# Patient Record
Sex: Female | Born: 1935 | Race: Black or African American | Hispanic: No | Smoking: Never smoker
Health system: Southern US, Community
[De-identification: ages and names within clinical notes are randomized; demographics above are authoritative.]

---

## 2010-09-02 ENCOUNTER — Inpatient Hospital Stay (HOSPITAL_COMMUNITY): Payer: Medicare Other

## 2010-09-02 ENCOUNTER — Emergency Department (HOSPITAL_COMMUNITY): Payer: Medicare Other

## 2010-09-02 ENCOUNTER — Observation Stay (HOSPITAL_COMMUNITY)
Admission: EM | Admit: 2010-09-02 | Discharge: 2010-09-05 | DRG: 563 | Disposition: A | Discharge status: Home Health | Payer: Medicare Other | Attending: Specialist | Admitting: Specialist

## 2010-09-02 DIAGNOSIS — G309 Alzheimer's disease, unspecified: Secondary | ICD-10-CM | POA: Diagnosis present

## 2010-09-02 DIAGNOSIS — W19XXXA Unspecified fall, initial encounter: Secondary | ICD-10-CM | POA: Diagnosis present

## 2010-09-02 DIAGNOSIS — Z9889 Other specified postprocedural states: Secondary | ICD-10-CM

## 2010-09-02 DIAGNOSIS — I10 Essential (primary) hypertension: Secondary | ICD-10-CM | POA: Diagnosis present

## 2010-09-02 DIAGNOSIS — S82109A Unspecified fracture of upper end of unspecified tibia, initial encounter for closed fracture: Principal | ICD-10-CM | POA: Diagnosis present

## 2010-09-02 DIAGNOSIS — F0280 Dementia in other diseases classified elsewhere without behavioral disturbance: Secondary | ICD-10-CM | POA: Diagnosis present

## 2010-09-02 DIAGNOSIS — G40802 Other epilepsy, not intractable, without status epilepticus: Secondary | ICD-10-CM | POA: Diagnosis present

## 2010-09-02 LAB — CBC
HCT: 32.3 % — ABNORMAL LOW (ref 36.0–46.0)
Hemoglobin: 10.4 g/dL — ABNORMAL LOW (ref 12.0–15.0)
RBC: 3.94 MIL/uL (ref 3.87–5.11)
WBC: 7.4 10*3/uL (ref 4.0–10.5)

## 2010-09-02 LAB — URINALYSIS, ROUTINE W REFLEX MICROSCOPIC
Glucose, UA: NEGATIVE mg/dL
Hgb urine dipstick: NEGATIVE
Ketones, ur: NEGATIVE mg/dL
Nitrite: NEGATIVE
Protein, ur: NEGATIVE mg/dL
pH: 7 (ref 5.0–8.0)

## 2010-09-02 LAB — DIFFERENTIAL
Basophils Absolute: 0 10*3/uL (ref 0.0–0.1)
Lymphocytes Relative: 34 % (ref 12–46)
Monocytes Absolute: 0.8 10*3/uL (ref 0.1–1.0)
Neutro Abs: 3.8 10*3/uL (ref 1.7–7.7)
Neutrophils Relative %: 52 % (ref 43–77)

## 2010-09-02 LAB — PROTIME-INR: INR: 1.01 (ref 0.00–1.49)

## 2010-09-02 LAB — APTT: aPTT: 27 seconds (ref 24–37)

## 2010-09-02 LAB — COMPREHENSIVE METABOLIC PANEL
ALT: 9 U/L (ref 0–35)
Albumin: 3.1 g/dL — ABNORMAL LOW (ref 3.5–5.2)
Alkaline Phosphatase: 63 U/L (ref 39–117)
Calcium: 10.2 mg/dL (ref 8.4–10.5)
GFR calc Af Amer: >60 mL/min (ref >60)
Potassium: 4.1 mEq/L (ref 3.5–5.1)
Sodium: 138 mEq/L (ref 135–145)
Total Protein: 6.7 g/dL (ref 6.0–8.3)

## 2010-09-03 ENCOUNTER — Inpatient Hospital Stay (HOSPITAL_COMMUNITY): Payer: Medicare Other

## 2010-09-14 NOTE — H&P (Signed)
  Catherine Delgado, Catherine Delgado              ACCOUNT NO.:  [id number]  MEDICAL RECORD NO.:  [id number]           PATIENT TYPE:  I  LOCATION:  5002                         FACILITY:  [facility]  PHYSICIAN:  Jene Every, M.D.    DATE OF BIRTH:  Sep 09, 1930  DATE OF ADMISSION:  09/02/2010 DATE OF DISCHARGE:                             HISTORY & PHYSICAL   CHIEF COMPLAINT:  Left leg pain.  HISTORY:  This is a 77 year old female who fell yesterday, history of Alzheimer's presents here with her daughter who is a travelling physician with a closed left proximal tibia fracture, who was seen in the Urgent Care last night and referred here for evaluation.  She has had no fevers or change in bowel or bladder function,  pain wakes him at night, unexplained recent weight loss.  PAST MEDICAL HISTORY:  Significant for hypertension, seizure disorder, and Alzheimer's.  ALLERGIES:  None.  SOCIAL HISTORY:  She lives with her daughter.  Tobacco, negative.  Alcohol beverages, negative.  MEDICATIONS:  Clonidine patch 0.2 mg weekly on Sunday.  PAST SURGICAL HISTORY:  ORIF of the hip and the distal femurs which resulted in a short left lower extremity with a fixed declines contracture of her foot.  PHYSICAL EXAMINATION:  HEENT: Within normal limits.  She is alert and oriented. PULMONARY:  Clear to auscultation. ABDOMEN:  Soft, nontender. COR:  Regular rate and rhythm. ABDOMEN:  Soft, nontender. PELVIS:  Stable left lower extremity, moderate soft tissue swelling of the knee.  Compartments are soft.  She has a plantar flexion of the foot.  There is 1+ dorsalis pedis posterior tibial pulses, which were equivalent bilaterally.  Upper extremity exam is unremarkable.  Radiographs demonstrate minimal evidence of proximal tibia fracture with osteopenia.  The hardware from the distal femoral fracture is noted.  LABORATORY DATA:  White count 7.4, hemoglobin 10.4, platelets 270, INR 1.01.  BMET is  pending.  IMPRESSION: 1. Closed left proximal tibia fracture, minimal displacement to     history of an ORIF of the ipsilateral hip and distal femur with     resultant short lower extremity external rotation, equinus     contracture. 2. Alzheimer's. 3. The patient's family reports history of multiple falls.  PLAN: 1. We discussed options in detail.  ORIF was a conservative treatment,     mutually agreed attempt conservative treatment at first, placement     of Jones dressing and a knee immobilizer followed by a     postreduction radiographs.  We performed a general reduction. 2. Admit for elevation, ice, neurovascular checks and disposition. 3. Deep venous thrombosis prophylaxis.  He has had aspirin for DVT     prophylaxis with multiple falls.  Head injury is possible and     therefore want to reduce the risk of intracranial bleed.  Discussed     this with the family in detail.     Jene Every, M.D.     Catherine Delgado  D:  09/02/2010  T:  09/03/2010  Job:  [id number]  Electronically Signed by Jene Every M.D. on 09/14/2010 01:29:59 PM

## 2010-09-14 NOTE — Discharge Summary (Signed)
  NAMEAMILIAH, Catherine Delgado              ACCOUNT NO.:  [id number]  MEDICAL RECORD NO.:  [id number]           PATIENT TYPE:  I  LOCATION:  5002                         FACILITY:  [facility]  PHYSICIAN:  Jene Every, M.D.    DATE OF BIRTH:  01-30-1931  DATE OF ADMISSION:  09/02/2010 DATE OF DISCHARGE:  09/05/2010                              DISCHARGE SUMMARY   ADMISSION DIAGNOSES: 1. Status post fall with left proximal tibia fracture. 2. Hypertension. 3. Seizure disorder. 4. Alzheimer's.  DISCHARGE DIAGNOSES: 1. Status post fall with left proximal tibia fracture. 2. Hypertension. 3. Seizure disorder. 4. Alzheimer's. 5. Status post closed reduction of left proximal tibia fracture.  HISTORY:  Catherine Delgado is a pleasant 77 year old female who unfortunately had a history of a fall.  She was traveling with her daughter who was a physician.  She was seen at Urgent Care and noted to have a close proximal tibia fracture, then referred to the emergency room for admission and definitive treatment.  PROCEDURES:  Dr. Shelle Iron performed closed reduction, application of Jones dressing as well as knee immobilizer without any sedation.  The patient did well with this.  The patient was admitted for pain management and placement issues.  ADMISSION LABORATORY DATA:  White cell count 7.4, hemoglobin 10.4, and hematocrit 32.3.  Chemistry shows sodium 138, potassium 4.1 with normal BUN and creatinine.  Urinalysis was negative.  No additional laboratory studies were performed during the hospital course.  RADIOGRAPHS:  Initial radiographs of the tibia showed slight displacement of proximal tibia fracture.  Repeat films showed anatomic alignment.  Admission chest x-ray showed borderline enlargement of cardiac silhouette.  Hospital course was uneventful.  The patient was admitted for pain control.  Treatment options were discussed with the patient as well as her daughter.  At this point, due to her  medical status, we would recommend conservative treatment with immobilization for approximately 6- 8 weeks with nonweightbearing at this point.  Aspirin for DVT prophylaxis.  The patient was in the hospital approximately 3 days. Again, hospital course was uneventful.  On admission day #3, it was felt the patient was stable to be discharged to nursing facility of choice. She should follow up with Dr. Shelle Iron in approximately 10-14 days for x- rays.  WOUND CARE:  Currently, she is in a Jones dressing as well as application of knee immobilizer.  She should be in this at all times daily and neurovascular checks should be performed to the left lower extremity.  She is to be nonweightbearing, ice and elevation as needed.  DISCHARGE MEDICATIONS:  As per med rec sheet.  DIET:  As tolerated.  CONDITION ON DISCHARGE:  Stable.  FINAL DIAGNOSIS:  Status post proximal left tibia fracture which will undergo conservative treatment.     Roma Schanz, P.A.   ______________________________ Jene Every, M.D.    CS/MEDQ  D:  09/04/2010  T:  09/05/2010  Job:  [id number]  Electronically Signed by Roma Schanz P.A. on 09/05/2010 09:26:18 AM Electronically Signed by Jene Every M.D. on 09/14/2010 01:29:57 PM

## 2011-10-26 DEATH — deceased

## 2012-10-16 IMAGING — CR DG TIBIA/FIBULA 2V*L*
2 series · 2 of 2 positions shown · non-contrast
Comparison: 09/02/2010

CLINICAL DATA: Trauma, fall, proximal tibia fracture

LEFT TIBIA AND FIBULA - 2 VIEW

[view not recorded (1 of 2)]
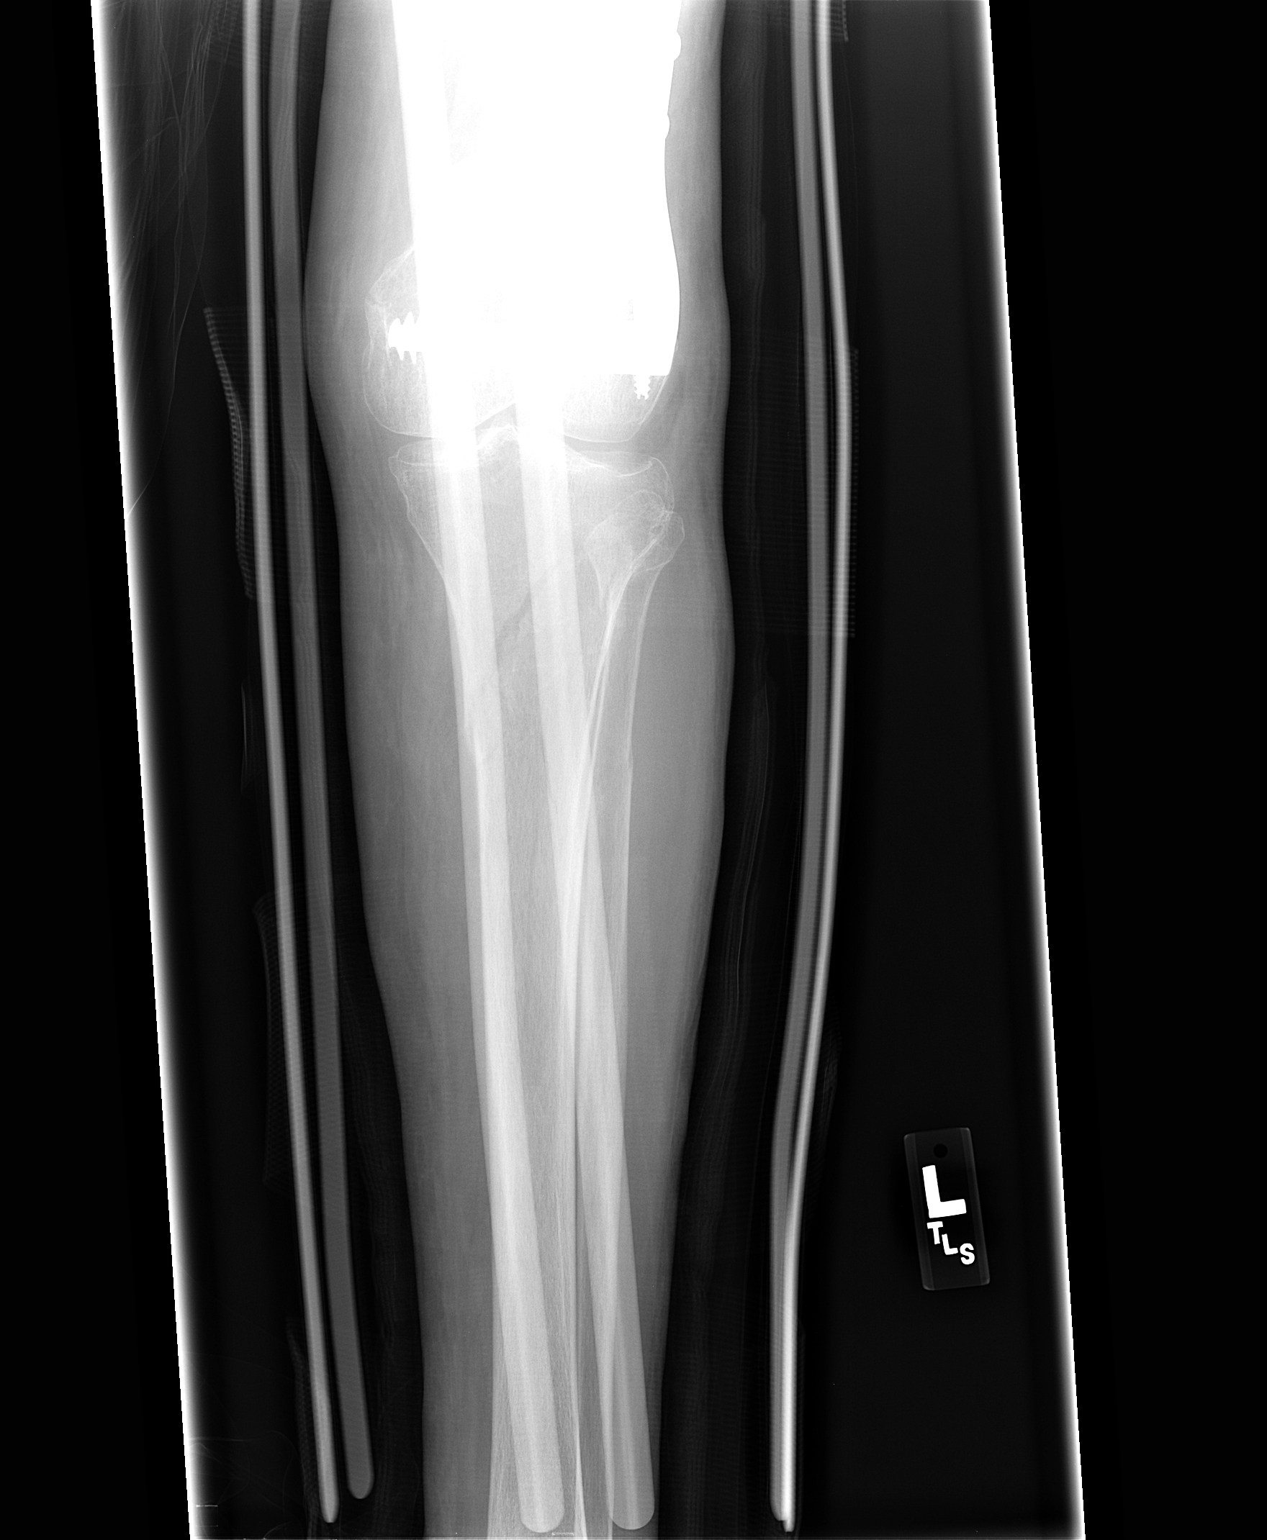

[view not recorded (2 of 2)]
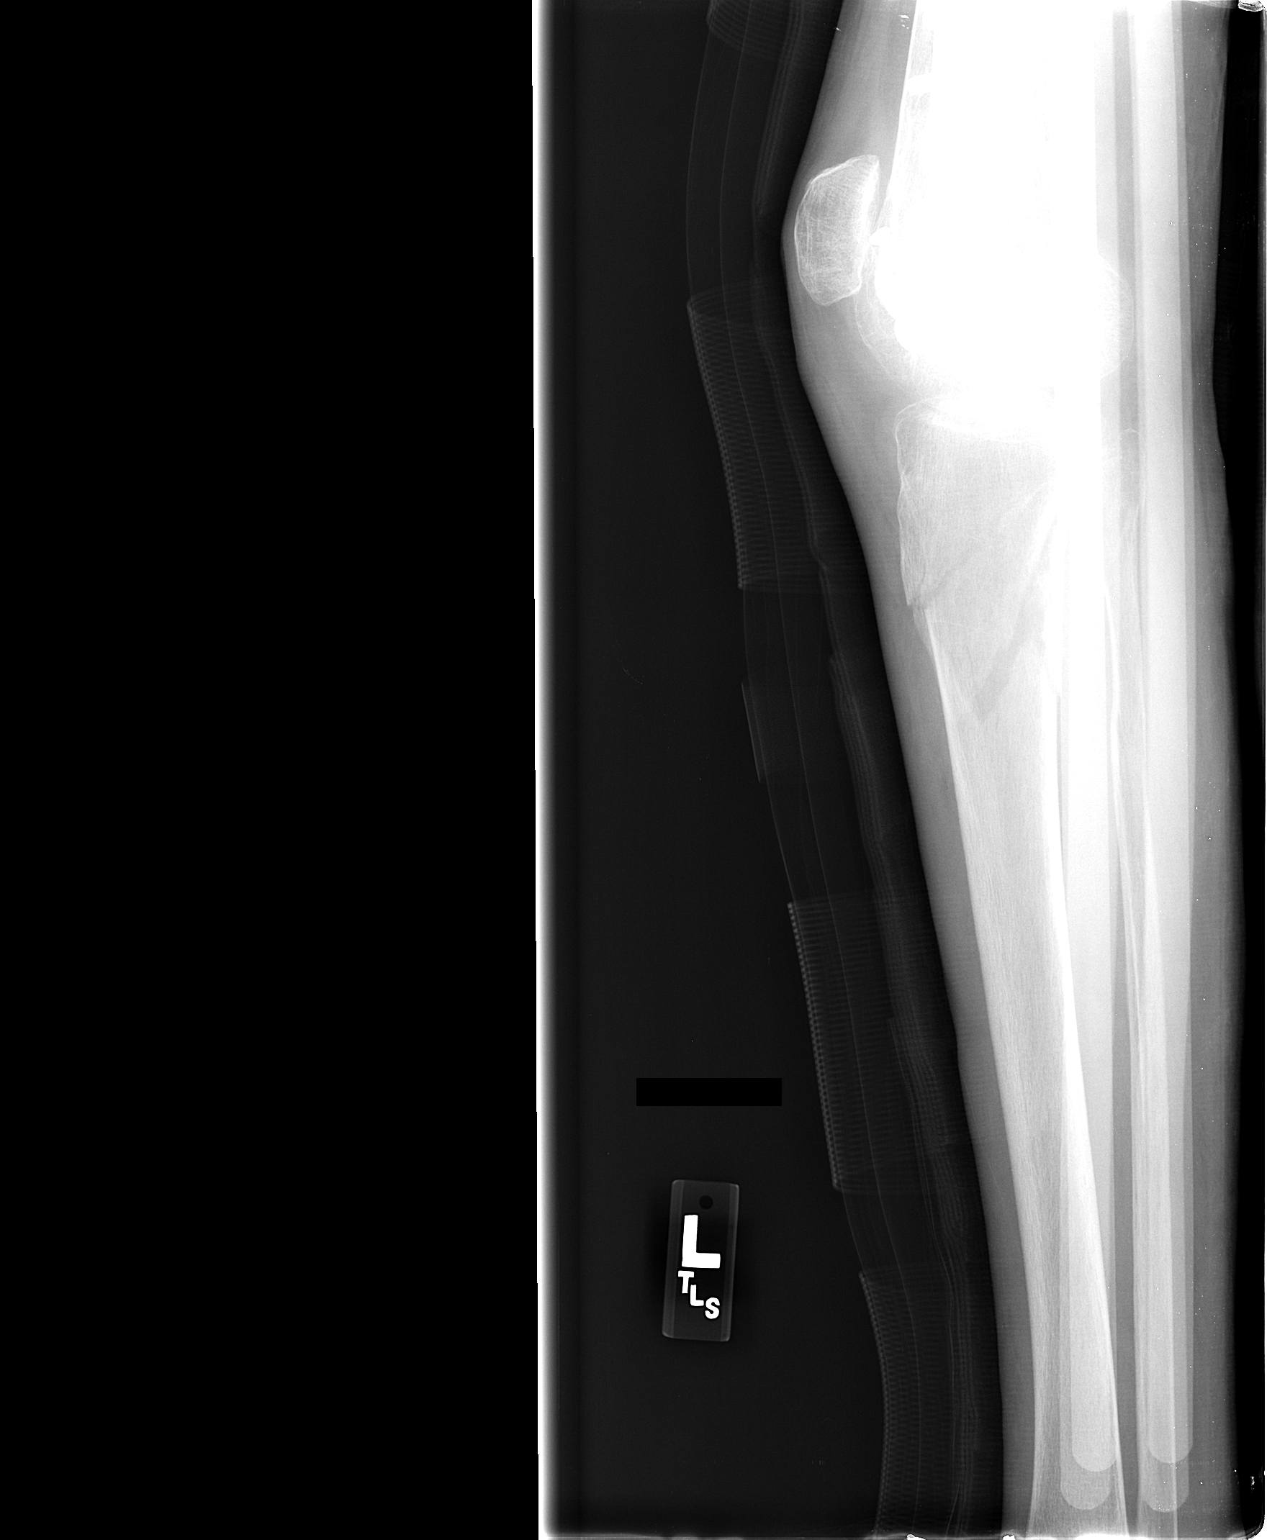

[2 of 2 positions shown; findings below may reference images not displayed]

FINDINGS: Proximal left tibia acute fracture again noted.  Slight
improvement in alignment compared to the prior study.  Postop
changes and fixation hardware of the left distal femur.
IMPRESSION: Slight improved alignment of the proximal tibia fracture.

## 2017-05-10 ENCOUNTER — Encounter: Payer: Self-pay | Admitting: Internal Medicine

## 2017-05-11 ENCOUNTER — Encounter: Payer: Self-pay | Admitting: Internal Medicine

## 2017-05-11 NOTE — Progress Notes (Signed)
Location:   [facility] cabin crew of Service:    [place], Provider Not In  Patient Care Team: System, Provider Not In as PCP - General  Extended Emergency Contact Information Primary Emergency Contact: Rosario Adie Address: [place], [place] [address] [place] Phone: [phone] Relation: Daughter    Allergies: Patient has no allergy information on record.  No chief complaint on file.   HPI: Patient is 84 y.o. female who   No past medical history on file.    Allergies as of 05/10/2017   Not on File     Medication List    as of 05/10/2017 11:59 PM   You have not been prescribed any medications.     No orders of the defined types were placed in this encounter.    There is no immunization history on file for this patient.  Social History   Tobacco Use  . Smoking status: Never Smoker  . Smokeless tobacco: Never Used  Substance Use Topics  . Alcohol use: No    Frequency: Never    Review of Systems  DATA OBTAINED: from patient, nurse, medical record, family member GENERAL:  no fevers, fatigue, appetite changes SKIN: No itching, rash HEENT: No complaint RESPIRATORY: No cough, wheezing, SOB CARDIAC: No chest pain, palpitations, lower extremity edema  GI: No abdominal pain, No N/V/D or constipation, No heartburn or reflux  GU: No dysuria, frequency or urgency, or incontinence  MUSCULOSKELETAL: No unrelieved bone/joint pain NEUROLOGIC: No headache, dizziness  PSYCHIATRIC: No overt anxiety or sadness  Vitals:   05/11/17 1143  BP: (!) 157/82  Pulse: 65  Resp: 20  Temp: 97.6 F (36.4 C)   There is no height or weight on file to calculate BMI. Physical Exam  GENERAL APPEARANCE: Alert, conversant, No acute distress  SKIN: No diaphoresis rash HEENT: Unremarkable RESPIRATORY: Breathing is even, unlabored. Lung sounds are clear   CARDIOVASCULAR: Heart RRR  no murmurs, rubs or gallops. No peripheral edema  GASTROINTESTINAL: Abdomen is soft, non-tender, not distended w/ normal bowel sounds.  GENITOURINARY: Bladder non tender, not distended  MUSCULOSKELETAL: No abnormal joints or musculature NEUROLOGIC: Cranial nerves 2-12 grossly intact. Moves all extremities PSYCHIATRIC: Mood and affect appropriate to situation, no behavioral issues  There are no active problems to display for this patient.   CMP     Component Value Date/Time   NA 138 09/02/2010 1652   K 4.1 09/02/2010 1652   CL 102 09/02/2010 1652   CO2 26 09/02/2010 1652   GLUCOSE 94 09/02/2010 1652   BUN 19 09/02/2010 1652   CREATININE 0.83 09/02/2010 1652   CALCIUM 10.2 09/02/2010 1652   PROT 6.7 09/02/2010 1652   ALBUMIN 3.1 (L) 09/02/2010 1652   AST 20 09/02/2010 1652   ALT 9 09/02/2010 1652   ALKPHOS 63 09/02/2010 1652   BILITOT 0.4 09/02/2010 1652   GFRNONAA >60 09/02/2010 1652   GFRAA  09/02/2010 1652    >60        The eGFR has been calculated using the MDRD equation. This calculation has not been validated in all clinical situations. eGFR's persistently <60 mL/min signify possible Chronic Kidney Disease.   No results for input(s): NA, K, CL, CO2, GLUCOSE, BUN, CREATININE, CALCIUM, MG, PHOS in the last 8760 hours. No results for input(s): AST, ALT, ALKPHOS, BILITOT, PROT, ALBUMIN in the last 8760 hours. No results  for input(s): WBC, NEUTROABS, HGB, HCT, MCV, PLT in the last 8760 hours. No results for input(s): CHOL, LDLCALC, TRIG in the last 8760 hours.  Invalid input(s): HCL No results found for: MICROALBUR No results found for: TSH No results found for: HGBA1C No results found for: CHOL, HDL, LDLCALC, LDLDIRECT, TRIG, CHOLHDL  Significant Diagnostic Results in last 30 days:  No results found.  Assessment and Plan  No problem-specific Assessment & Plan notes found for this encounter.   Labs/tests ordered:    Inocencio Homes, MD   This encounter  was created in error - please disregard.
